# Patient Record
Sex: Male | Born: 1980 | Race: White | Hispanic: Yes | Marital: Single | Smoking: Never smoker
Health system: Southern US, Community
[De-identification: ages and names within clinical notes are randomized; demographics above are authoritative.]

## PROBLEM LIST (undated history)

## (undated) DIAGNOSIS — E039 Hypothyroidism, unspecified: Secondary | ICD-10-CM

## (undated) DIAGNOSIS — N390 Urinary tract infection, site not specified: Secondary | ICD-10-CM

## (undated) DIAGNOSIS — F329 Major depressive disorder, single episode, unspecified: Secondary | ICD-10-CM

## (undated) DIAGNOSIS — G8929 Other chronic pain: Secondary | ICD-10-CM

## (undated) DIAGNOSIS — R519 Headache, unspecified: Secondary | ICD-10-CM

## (undated) DIAGNOSIS — R51 Headache: Secondary | ICD-10-CM

## (undated) DIAGNOSIS — F32A Depression, unspecified: Secondary | ICD-10-CM

## (undated) HISTORY — DX: Depression, unspecified: F32.A

## (undated) HISTORY — DX: Headache: R51

## (undated) HISTORY — DX: Urinary tract infection, site not specified: N39.0

## (undated) HISTORY — DX: Hypothyroidism, unspecified: E03.9

## (undated) HISTORY — DX: Headache, unspecified: R51.9

## (undated) HISTORY — DX: Other chronic pain: G89.29

## (undated) HISTORY — DX: Major depressive disorder, single episode, unspecified: F32.9

---

## 2006-03-06 ENCOUNTER — Emergency Department (HOSPITAL_COMMUNITY): Admission: EM | Admit: 2006-03-06 | Discharge: 2006-03-07 | Payer: Self-pay | Admitting: Emergency Medicine

## 2006-03-28 ENCOUNTER — Ambulatory Visit (HOSPITAL_COMMUNITY): Admission: RE | Admit: 2006-03-28 | Discharge: 2006-03-28 | Payer: Self-pay | Admitting: *Deleted

## 2006-04-18 ENCOUNTER — Ambulatory Visit (HOSPITAL_COMMUNITY): Admission: RE | Admit: 2006-04-18 | Discharge: 2006-04-18 | Payer: Self-pay | Admitting: *Deleted

## 2016-04-07 ENCOUNTER — Emergency Department (HOSPITAL_COMMUNITY)
Admission: EM | Admit: 2016-04-07 | Discharge: 2016-04-07 | Disposition: A | Discharge status: Home / Self Care | Payer: BLUE CROSS/BLUE SHIELD | Attending: Emergency Medicine | Admitting: Emergency Medicine

## 2016-04-07 ENCOUNTER — Emergency Department (HOSPITAL_COMMUNITY): Payer: BLUE CROSS/BLUE SHIELD

## 2016-04-07 ENCOUNTER — Encounter (HOSPITAL_COMMUNITY): Payer: Self-pay | Admitting: Emergency Medicine

## 2016-04-07 DIAGNOSIS — R740 Nonspecific elevation of levels of transaminase and lactic acid dehydrogenase [LDH]: Secondary | ICD-10-CM | POA: Diagnosis not present

## 2016-04-07 DIAGNOSIS — R17 Unspecified jaundice: Secondary | ICD-10-CM | POA: Diagnosis present

## 2016-04-07 DIAGNOSIS — Z7982 Long term (current) use of aspirin: Secondary | ICD-10-CM | POA: Diagnosis not present

## 2016-04-07 DIAGNOSIS — R4789 Other speech disturbances: Secondary | ICD-10-CM | POA: Diagnosis not present

## 2016-04-07 DIAGNOSIS — R7401 Elevation of levels of liver transaminase levels: Secondary | ICD-10-CM

## 2016-04-07 LAB — COMPREHENSIVE METABOLIC PANEL
ALBUMIN: 5.3 g/dL — AB (ref 3.5–5.0)
ALT: 70 U/L — AB (ref 17–63)
AST: 122 U/L — AB (ref 15–41)
Alkaline Phosphatase: 40 U/L (ref 38–126)
Anion gap: 8 (ref 5–15)
BUN: 16 mg/dL (ref 6–20)
CHLORIDE: 102 mmol/L (ref 101–111)
CO2: 29 mmol/L (ref 22–32)
CREATININE: 1.54 mg/dL — AB (ref 0.61–1.24)
Calcium: 9.2 mg/dL (ref 8.9–10.3)
GFR calc Af Amer: >60 mL/min (ref >60)
GFR, EST NON AFRICAN AMERICAN: 57 mL/min — AB (ref >60)
GLUCOSE: 73 mg/dL (ref 65–99)
POTASSIUM: 4 mmol/L (ref 3.5–5.1)
Sodium: 139 mmol/L (ref 135–145)
Total Bilirubin: 1 mg/dL (ref 0.3–1.2)
Total Protein: 8.2 g/dL — ABNORMAL HIGH (ref 6.5–8.1)

## 2016-04-07 LAB — CBC WITH DIFFERENTIAL/PLATELET
BASOS ABS: 0.1 10*3/uL (ref 0.0–0.1)
Basophils Relative: 1 %
EOS PCT: 1 %
Eosinophils Absolute: 0.1 10*3/uL (ref 0.0–0.7)
HEMATOCRIT: 40 % (ref 39.0–52.0)
Hemoglobin: 13.6 g/dL (ref 13.0–17.0)
LYMPHS ABS: 1.4 10*3/uL (ref 0.7–4.0)
LYMPHS PCT: 32 %
MCH: 31.1 pg (ref 26.0–34.0)
MCHC: 34 g/dL (ref 30.0–36.0)
MCV: 91.5 fL (ref 78.0–100.0)
MONO ABS: 0.3 10*3/uL (ref 0.1–1.0)
MONOS PCT: 7 %
NEUTROS ABS: 2.5 10*3/uL (ref 1.7–7.7)
Neutrophils Relative %: 59 %
PLATELETS: 160 10*3/uL (ref 150–400)
RBC: 4.37 MIL/uL (ref 4.22–5.81)
RDW: 14.2 % (ref 11.5–15.5)
WBC: 4.3 10*3/uL (ref 4.0–10.5)

## 2016-04-07 LAB — URINALYSIS, ROUTINE W REFLEX MICROSCOPIC
Bilirubin Urine: NEGATIVE
GLUCOSE, UA: NEGATIVE mg/dL
HGB URINE DIPSTICK: NEGATIVE
KETONES UR: NEGATIVE mg/dL
LEUKOCYTES UA: NEGATIVE
Nitrite: NEGATIVE
PH: 6 (ref 5.0–8.0)
Protein, ur: NEGATIVE mg/dL
Specific Gravity, Urine: 1.022 (ref 1.005–1.030)

## 2016-04-07 LAB — LIPASE, BLOOD: Lipase: 38 U/L (ref 11–51)

## 2016-04-07 MED ORDER — SODIUM CHLORIDE 0.9 % IV SOLN: INTRAVENOUS | Status: DC

## 2016-04-07 MED ORDER — SODIUM CHLORIDE 0.9 % IV BOLUS (SEPSIS)
1000.0000 mL | Freq: Once | INTRAVENOUS | Status: AC
  Administered 2016-04-07: 1000 mL via INTRAVENOUS

## 2016-04-07 NOTE — ED Notes (Signed)
MD at bedside. 

## 2016-04-07 NOTE — ED Notes (Signed)
Pt in CT.

## 2016-04-07 NOTE — ED Notes (Signed)
ED Provider at bedside. 

## 2016-04-07 NOTE — Progress Notes (Signed)
ED CM consulted by EDP to assist pt with a list of [employer] in network so pt can follow up with a provider at discharge ED CM spoke with pt with assist of male visitor at bedside who speaks english CM relayed to the pt that she was providing a list of in network [employer] providers for pt to choose from to find a pcp for medical care follow up services as EDP recommends Pt and visitor appreciative of services offered

## 2016-04-07 NOTE — Progress Notes (Signed)
Entered in d/c instructions Please use the resources provided to you in emergency room by case manager to assist you're your choice of doctor for follow up     These are a list of [employer] providers to assist you with finding a doctor for follow up care    Next Steps: Schedule an appointment as soon as possible for a visit

## 2016-04-07 NOTE — Discharge Instructions (Signed)
Es muy importante que toma aqua suficiente.  Necesita hacer una cita con nuestra especialista de cerebro, y con un doctor primario.  Regresa aqui por cualquier cosa que parece raro o si hay cambios nuevos

## 2016-04-07 NOTE — ED Provider Notes (Signed)
[facility]-EMERGENCY DEPT Provider Note   CSN: [id number] Arrival date & time: 04/07/16  1019     History   Chief Complaint Chief Complaint  Patient presents with  . Jaundice  . Weakness  . Joint Swelling    HPI Joseph Weeks is a 35 y.o. male.  HPI Patient presents with concern of ongoing nausea, weakness, jaundice. Patient has had episodic pain in his upper abdomen, mid back during this illness which has in present for about 2 months. During this illness the patient has seen one outpatient provider, was told that his liver was dysfunctional, but not provided cause. Patient has been taking cyclobenzaprine, ibuprofen, with no relief for his intermittent back pain. Patient also complains of difficulty with speech with new slurring. Onset was at least a few days ago. Patient is here with companion to assist with the history of present illness. Patient is from [place], arrived here 16 years ago. Patient does not smoke, does not drink.  History reviewed. No pertinent past medical history.  There are no active problems to display for this patient.   History reviewed. No pertinent surgical history.     Home Medications    Prior to Admission medications   Medication Sig Start Date End Date Taking? Authorizing Provider  aspirin 325 MG tablet Take 325 mg by mouth daily as needed for mild pain.   Yes Historical Provider, MD  cyclobenzaprine (FLEXERIL) 10 MG tablet Take 1 tablet by mouth daily as needed for muscle spasms.  03/24/16  Yes Historical Provider, MD  diclofenac (VOLTAREN) 75 MG EC tablet Take 1 tablet by mouth 2 (two) times daily. 03/24/16  Yes Historical Provider, MD    Family History History reviewed. No pertinent family history.  Social History Social History  Substance Use Topics  . Smoking status: Never Smoker  . Smokeless tobacco: Never Used  . Alcohol use No     Allergies   Patient has no known allergies.   Review of Systems Review of Systems    Constitutional:       Per HPI, otherwise negative  HENT:       Per HPI, otherwise negative  Respiratory:       Per HPI, otherwise negative  Cardiovascular:       Per HPI, otherwise negative  Gastrointestinal: Positive for nausea. Negative for vomiting.  Endocrine:       Negative aside from HPI  Genitourinary:       Neg aside from HPI   Musculoskeletal:       Per HPI, otherwise negative  Skin: Positive for color change.  Neurological: Positive for speech difficulty. Negative for syncope.     Physical Exam Updated Vital Signs BP 122/96   Pulse (!) 58   Temp 97.9 F (36.6 C) (Oral)   Resp 16   SpO2 100%   Physical Exam  Constitutional: He is oriented to person, place, and time. He appears well-developed. No distress.  HENT:  Head: Normocephalic and atraumatic.  Eyes: Conjunctivae and EOM are normal.  Cardiovascular: Normal rate and regular rhythm.   Pulmonary/Chest: Effort normal. No stridor. No respiratory distress.  Abdominal: He exhibits no distension. There is no tenderness.  Musculoskeletal: He exhibits no edema.  Neurological: He is alert and oriented to person, place, and time.  Speech is slurred, but patient is awake, alert, oriented 3  Skin: Skin is warm and dry.  No appreciable jaundice  Psychiatric: He has a normal mood and affect.  Nursing note and vitals reviewed.  ED Treatments / Results  Labs (all labs ordered are listed, but only abnormal results are displayed) Labs Reviewed  COMPREHENSIVE METABOLIC PANEL - Abnormal; Notable for the following:       Result Value   Creatinine, Ser 1.54 (*)    Total Protein 8.2 (*)    Albumin 5.3 (*)    AST 122 (*)    ALT 70 (*)    GFR calc non Af Amer 57 (*)    All other components within normal limits  LIPASE, BLOOD  CBC WITH DIFFERENTIAL/PLATELET  URINALYSIS, ROUTINE W REFLEX MICROSCOPIC (NOT AT [facility])     Radiology Ct Head Wo Contrast  Result Date: 04/07/2016 CLINICAL DATA:  Weakness. EXAM: CT  HEAD WITHOUT CONTRAST TECHNIQUE: Contiguous axial images were obtained from the base of the skull through the vertex without intravenous contrast. COMPARISON:  None. FINDINGS: Brain: No mass effect or midline shift is noted. Ventricular size is within normal limits. There is no evidence of mass lesion, hemorrhage or acute infarction. Vascular: No abnormality seen. Skull: Bony calvarium appears intact. Sinuses/Orbits: Visualized paranasal sinuses appear normal. Other: None. IMPRESSION: Normal head CT. Electronically Signed   By: Lupita RaiderJames  Green Jr, M.D.   On: 04/07/2016 15:16   [place] Abdomen Complete  Result Date: 04/07/2016 CLINICAL DATA:  Abdominal pain and nausea for 2 months. Elevated liver function tests. EXAM: ABDOMEN ULTRASOUND COMPLETE COMPARISON:  03/28/2006 CT abdomen/ pelvis. FINDINGS: Gallbladder: No gallstones or wall thickening visualized. No sonographic Murphy sign noted by sonographer. Common bile duct: Diameter: 4 mm Liver: No focal lesion identified. Within normal limits in parenchymal echogenicity. IVC: No abnormality visualized. Pancreas: Not visualized due to overlying bowel gas. Spleen: Size and appearance within normal limits. Right Kidney: Length: 10.9 cm. Echogenicity within normal limits. No mass or hydronephrosis visualized. Left Kidney: Length: 11.1 cm. Echogenicity within normal limits. No mass. Mild left hydronephrosis, not appreciably changed since 03/28/2006 CT study. Abdominal aorta: No aneurysm visualized. Other findings: None. IMPRESSION: 1. Mild left hydronephrosis, probably a chronic finding that is not appreciably changed since 03/28/2006 CT study. If clinically desired, a CT of the abdomen and pelvis without and with IV contrast could be obtained to exclude an obstructing left urinary tract lesion. 2. Otherwise normal abdominal sonogram, with no cholelithiasis. Electronically Signed   By: Delbert PhenixJason A Poff M.D.   On: 04/07/2016 13:43    Procedures Procedures (including critical  care time)  Medications Ordered in ED Medications  0.9 %  sodium chloride infusion (not administered)  sodium chloride 0.9 % bolus 1,000 mL (0 mLs Intravenous Stopped 04/07/16 1236)     Initial Impression / Assessment and Plan / ED Course  I have reviewed the triage vital signs and the nursing notes.  Pertinent labs & imaging results that were available during my care of the patient were reviewed by me and considered in my medical decision making (see chart for details).  Clinical Course     Update:  Patient in no distress. US / labs reviewed with him.  We discussed the importance of GI f/u.Patient now describes intermittent paresthesia in his extremities, on repeat exam he has no objective neurologic deficits.  I reviewed the importance of following up with her gastroenterology, and a primary care physician.  I discussed this case with our social worker colleagues to facilitate outpatient follow-up.   Final Clinical Impressions(s) / ED Diagnoses  Elevated transaminases Speech changes    Gerhard Munchobert Henleigh Robello, MD 04/07/16 [phone]

## 2016-04-07 NOTE — ED Triage Notes (Signed)
Pt states that for the past 2 months, he has been yellow.  Was told one month ago that his liver enzymes were elevated but there has not been any exact dx.  Other sx include: swelling of hands and feet, lumbar pain, chills, weakness, complains of cramping pain in legs and hands. Denies vomiting or diarrhea.  Denies being a drinker.  Family states that he just appears "ill".

## 2016-04-21 ENCOUNTER — Encounter: Payer: Self-pay | Admitting: Internal Medicine

## 2016-04-21 ENCOUNTER — Other Ambulatory Visit: Payer: BLUE CROSS/BLUE SHIELD

## 2016-04-21 ENCOUNTER — Ambulatory Visit (INDEPENDENT_AMBULATORY_CARE_PROVIDER_SITE_OTHER): Payer: BLUE CROSS/BLUE SHIELD | Admitting: Internal Medicine

## 2016-04-21 VITALS — BP 122/80 | HR 72 | Ht 63.0 in | Wt 157.0 lb

## 2016-04-21 DIAGNOSIS — R748 Abnormal levels of other serum enzymes: Secondary | ICD-10-CM

## 2016-04-21 DIAGNOSIS — E039 Hypothyroidism, unspecified: Secondary | ICD-10-CM

## 2016-04-21 NOTE — Patient Instructions (Signed)
   Today we are giving you information to read on Hypothyroidism.   Make sure and take your thyroid medicine daily.     Your physician has requested that you go to the basement for the following lab work before leaving today: Hepatitis testing    I appreciate the opportunity to care for you. Stan Headarl Gessner, MD, [facility]

## 2016-04-21 NOTE — Progress Notes (Signed)
Joseph Weeks 34 y.o. [birth year] [id number]  Assessment & Plan:   Encounter Diagnoses  Name Primary?  . Abnormal transaminases Yes  . Hypothyroidism, unspecified type     I think it is most likely all of his problems are from his hypothyroidism which was just diagnosed. Check an acute hepatitis panel for completeness at this point but I have suggested that he started on his thyroid replacement and follow-up with his primary care provider and I will be available if needed.   [facility]c[address] [facility] [address] [place] [phone]  Subjective:   Chief Complaint: Abnormal liver tests  HPI The patient is a very nice 35 year old man from [place] seen with the assistance of a Spanish interpreter today, who had abnormal transaminases detected on recent laboratory testing. On 03/31/2016 he had an AST 84 ALT 60, went to the emergency department with complaints of hoarseness, fatigue, and generally not feeling well, and his AST was 122 and ALT 70. His creatinine has been mildly elevated 1.54. He does not drink significant amounts of alcohol, no clear hepatitis risk factors i.e. no multiple sexual partners or needles her transfusions etc.  Lab studies on the 10th also showed a TSH of 117.3. It sounds like just recently was called and told he needed to start Synthroid. In the emergency department he had an abdominal ultrasound without any abnormality and a CT of the head without any abnormality, 04/07/2016 He complains of a lot of fatigue, he has multiple aches and pains, his hands are puffy, he feels mentally dull and somewhat depressed. He has been gaining some weight. He does not seem to be constipated.  No Known Allergies Outpatient Medications Prior to Visit  Medication Sig Dispense Refill  . aspirin 325 MG tablet Take 325 mg by mouth daily as needed for mild pain.    . cyclobenzaprine (FLEXERIL) 10 MG tablet Take 1 tablet by mouth daily as needed for muscle spasms.    0  . diclofenac (VOLTAREN) 75 MG EC tablet Take 1 tablet by mouth 2 (two) times daily.  0   No facility-administered medications prior to visit.    Past Medical History:  Diagnosis Date  . Chronic headache   . Depression   . Hypothyroidism   . UTI (urinary tract infection)    History reviewed. No pertinent surgical history. Social History   Social History  . Marital status: Single    Spouse name: N/A  . Number of children: 1  . Years of education: N/A   Social History Main Topics  . Smoking status: Never Smoker  . Smokeless tobacco: Never Used  . Alcohol use Yes     Comment: sometimes  . Drug use: No  . 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 There is occasional or rare alcohol not much. Family history is negative for liver disease.     Review of Systems As per history of present illness. He is not sleeping well also. All other review of systems are negative.  Objective:   Physical Exam @BP  122/80   Pulse 72   Ht 5\' 3"  (1.6 m)   Wt 157 lb (71.2 kg)   BMI 27.81 kg/m @  General:  Well-developed, well-nourished and in no acute distress Eyes:  anicteric. ENT:   Mouth and posterior pharynx free of lesions.  Neck:   supple w/mild  thyromegaly or mass.  Lungs: Clear to auscultation bilaterally. Heart:  S1S2, no  rubs, murmurs, gallops. Abdomen:  soft, non-tender, no hepatosplenomegaly, hernia, or mass and BS+.  Lymph:  no cervical or supraclavicular adenopathy. Extremities:   Hands midly puffy Skin   no rash. Neuro:  A&O x 3.  Psych:  appropriate mood and  Affect.   Data Reviewed: The history of present illness. I look at his primary care labs, the ER notes, imaging labs as above.

## 2016-04-22 LAB — HEPATITIS PANEL, ACUTE
HCV Ab: NEGATIVE
HEP A IGM: NONREACTIVE
HEP B C IGM: NONREACTIVE
HEP B S AG: NEGATIVE

## 2016-04-24 ENCOUNTER — Telehealth: Payer: Self-pay

## 2016-04-24 NOTE — Telephone Encounter (Signed)
Faxed Dr Marvell FullerGessner's office note from 04/21/16 to [facility] at fax # [phone] for their records.

## 2016-04-24 NOTE — Telephone Encounter (Signed)
-----   Message from Iva Booparl E Gessner, MD sent at 04/21/2016  6:27 PM EST ----- Regarding: cc please Call the # listed on the CC in my note and fax to them please

## 2016-04-25 ENCOUNTER — Encounter: Payer: Self-pay | Admitting: Internal Medicine

## 2016-04-25 NOTE — Progress Notes (Signed)
No infectious hepatitis F/u PCP as planned See GI prn He speaks Spanish - ok to mail to him with explanation

## 2018-02-20 IMAGING — CT CT HEAD W/O CM
3 of 4 series · 14 of 47 positions shown, 16 images · non-contrast
Comparison: None.

CLINICAL DATA: Weakness.

EXAM:
CT HEAD WITHOUT CONTRAST
TECHNIQUE: Contiguous axial images were obtained from the base of the skull
through the vertex without intravenous contrast.

[Series 2: head w/o · axial · non-contrast · 0.45mm/px · z∈[+1220,+1340]mm · 8 of 28 slices shown, 10 images]
[im 2/28  brain]
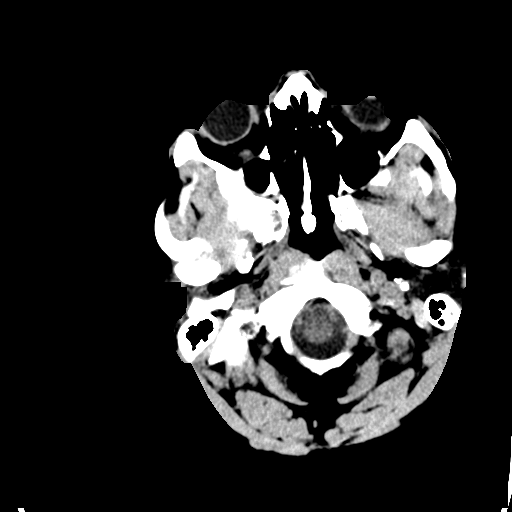
[im 2/28  bone]
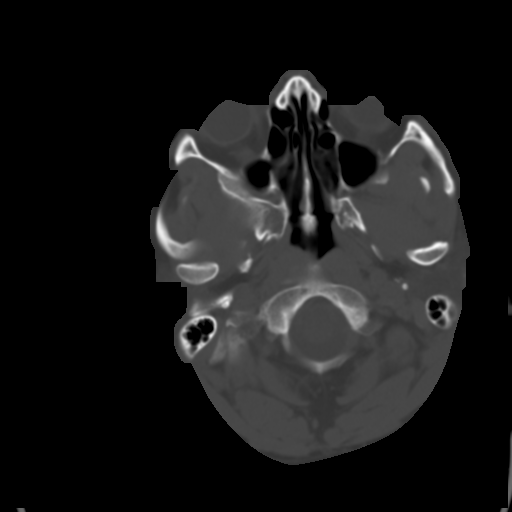
[im 6/28  brain]
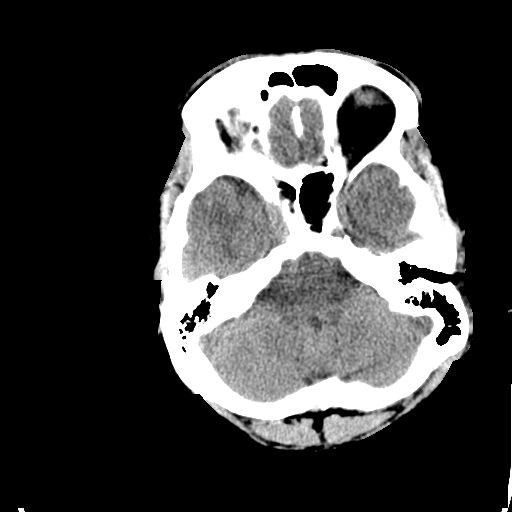
[im 10/28  brain]
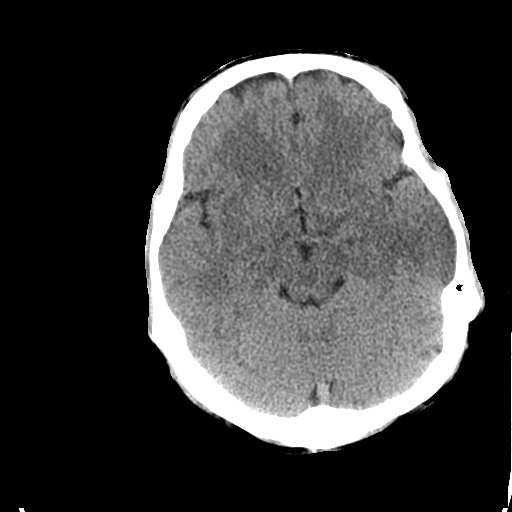
[im 12/28  brain]
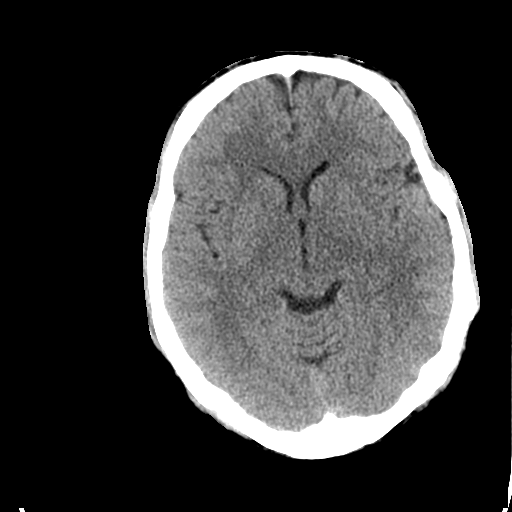
[im 16/28  brain]
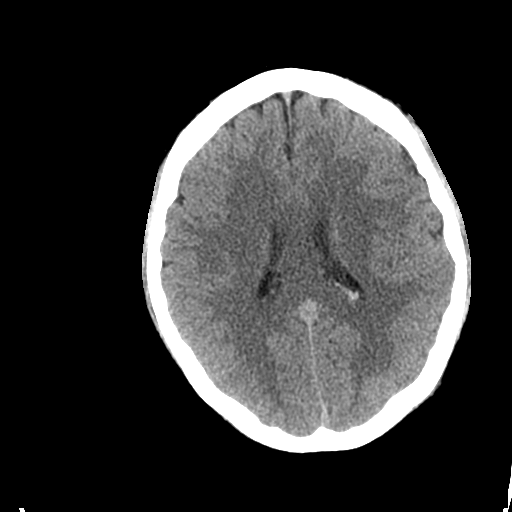
[im 16/28  bone]
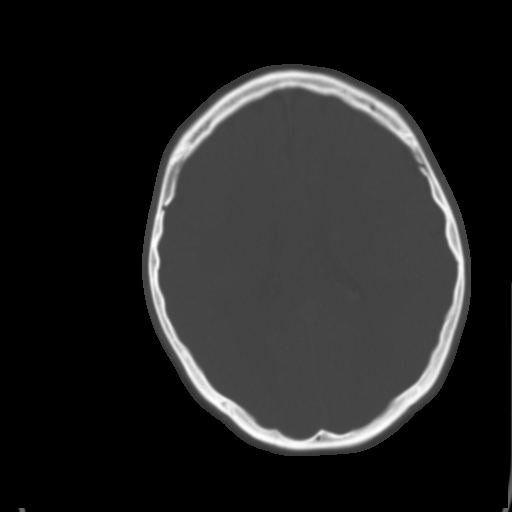
[im 18/28  brain]
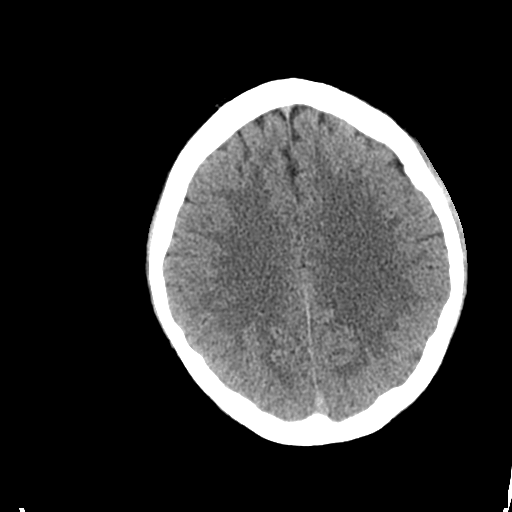
[im 22/28  brain]
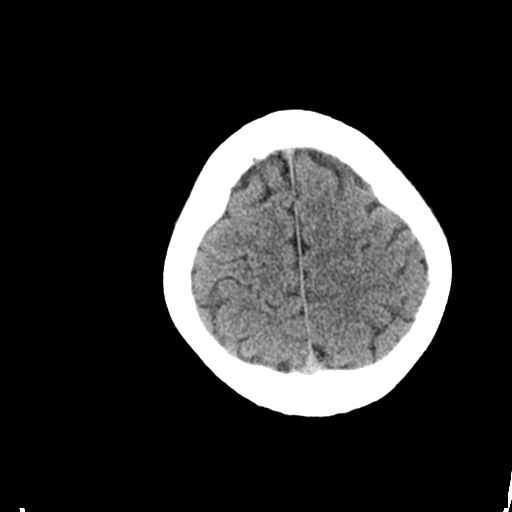
[im 26/28  brain]
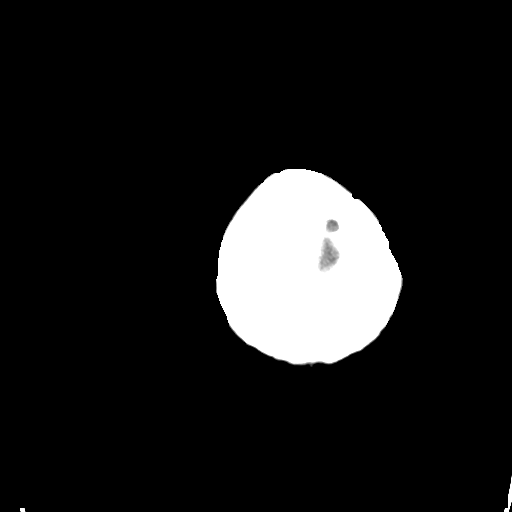

[Series 4: coronal · coronal · 0.31mm/px · 3 of 62 slices shown]
[im 21/62  brain]
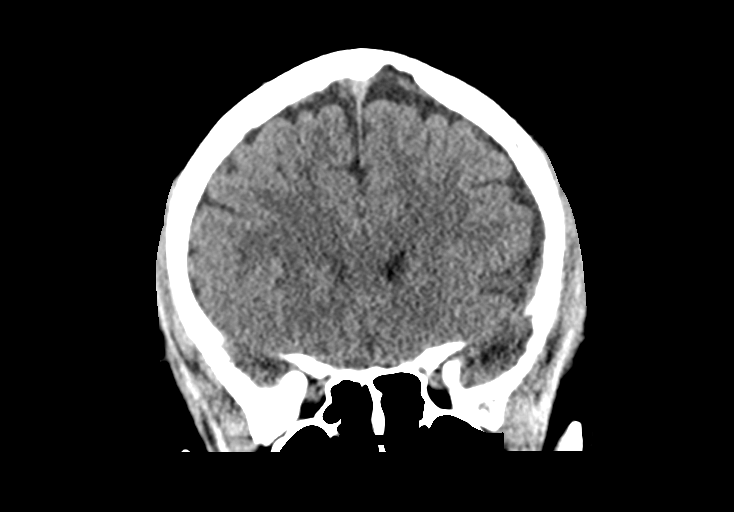
[im 28/62  brain]
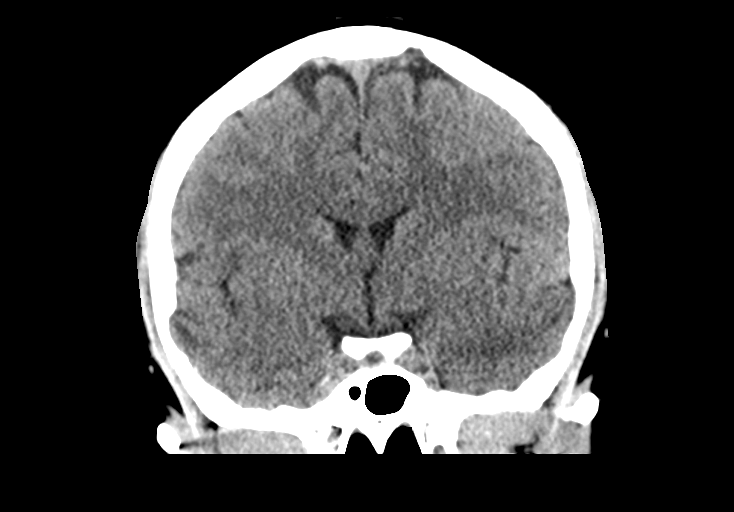
[im 34/62  brain]
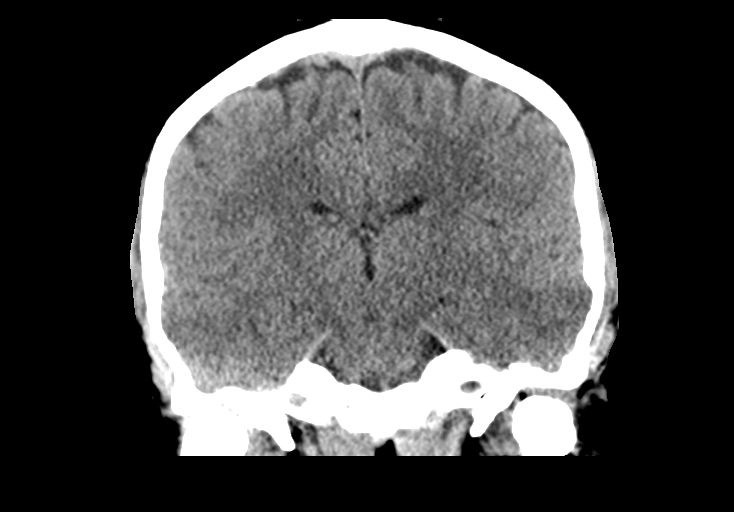

[Series 5: sagittal · sagittal · 0.31mm/px · 3 of 52 slices shown]
[im 18/52  brain]
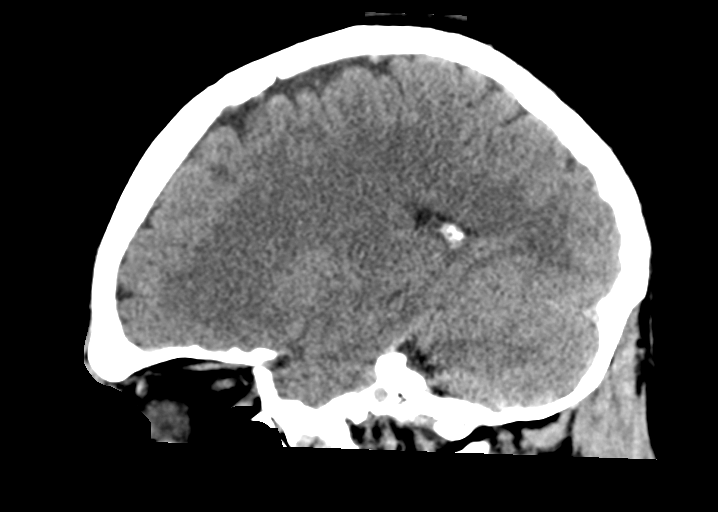
[im 26/52  brain]
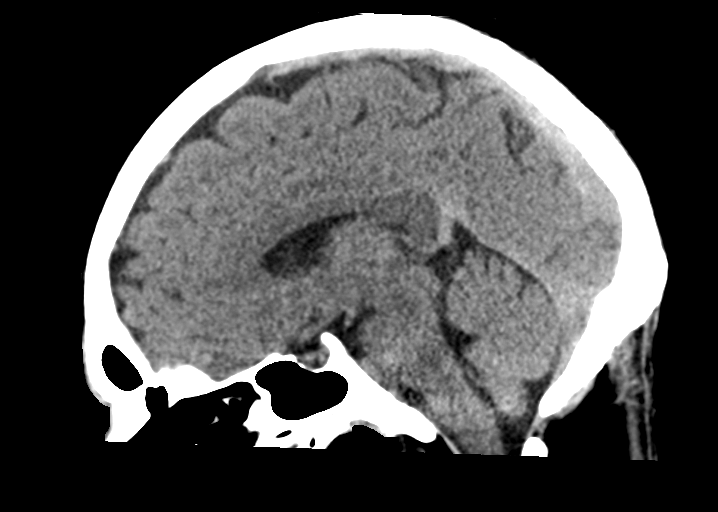
[im 35/52  brain]
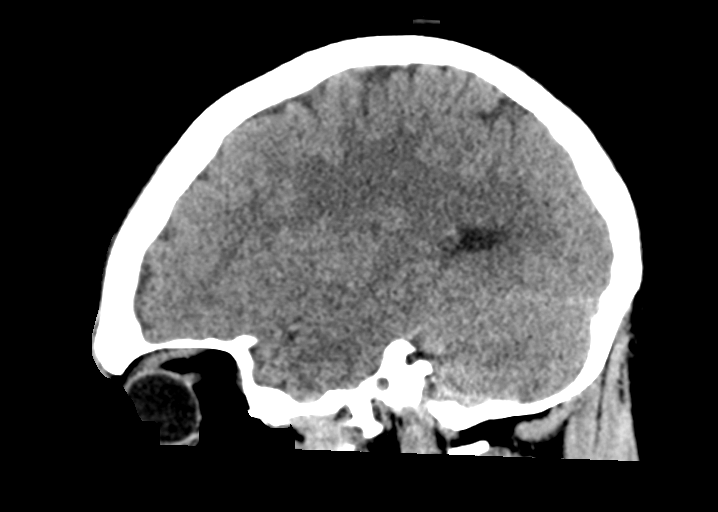

[14 of 47 positions shown; findings below may reference images not displayed]

FINDINGS: Brain: No mass effect or midline shift is noted. Ventricular size is
within normal limits. There is no evidence of mass lesion,
hemorrhage or acute infarction.

Vascular: No abnormality seen.

Skull: Bony calvarium appears intact.

Sinuses/Orbits: Visualized paranasal sinuses appear normal.

Other: None.
IMPRESSION: Normal head CT.
